# Patient Record
Sex: Male | Born: 1985 | Race: Asian | Hispanic: Yes | Marital: Single | Smoking: Never smoker
Health system: Southern US, Community
[De-identification: ages and names within clinical notes are randomized; demographics above are authoritative.]

---

## 2014-11-10 ENCOUNTER — Emergency Department (HOSPITAL_COMMUNITY)
Admission: EM | Admit: 2014-11-10 | Discharge: 2014-11-10 | Disposition: A | Discharge status: Home / Self Care | Payer: No Typology Code available for payment source | Attending: Emergency Medicine | Admitting: Emergency Medicine

## 2014-11-10 ENCOUNTER — Emergency Department (HOSPITAL_COMMUNITY): Payer: No Typology Code available for payment source

## 2014-11-10 ENCOUNTER — Encounter (HOSPITAL_COMMUNITY): Payer: Self-pay | Admitting: *Deleted

## 2014-11-10 DIAGNOSIS — S199XXA Unspecified injury of neck, initial encounter: Secondary | ICD-10-CM | POA: Diagnosis not present

## 2014-11-10 DIAGNOSIS — Y998 Other external cause status: Secondary | ICD-10-CM | POA: Insufficient documentation

## 2014-11-10 DIAGNOSIS — Y9389 Activity, other specified: Secondary | ICD-10-CM | POA: Diagnosis not present

## 2014-11-10 DIAGNOSIS — S0990XA Unspecified injury of head, initial encounter: Secondary | ICD-10-CM | POA: Diagnosis present

## 2014-11-10 DIAGNOSIS — Y92410 Unspecified street and highway as the place of occurrence of the external cause: Secondary | ICD-10-CM | POA: Diagnosis not present

## 2014-11-10 LAB — CBC
HEMATOCRIT: 43.6 % (ref 39.0–52.0)
HEMOGLOBIN: 15.9 g/dL (ref 13.0–17.0)
MCH: 31.5 pg (ref 26.0–34.0)
MCHC: 36.5 g/dL — ABNORMAL HIGH (ref 30.0–36.0)
MCV: 86.3 fL (ref 78.0–100.0)
Platelets: 227 10*3/uL (ref 150–400)
RBC: 5.05 MIL/uL (ref 4.22–5.81)
RDW: 12.1 % (ref 11.5–15.5)
WBC: 6.9 10*3/uL (ref 4.0–10.5)

## 2014-11-10 LAB — RAPID URINE DRUG SCREEN, HOSP PERFORMED
Amphetamines: NOT DETECTED
BENZODIAZEPINES: NOT DETECTED
Barbiturates: NOT DETECTED
COCAINE: NOT DETECTED
OPIATES: NOT DETECTED
Tetrahydrocannabinol: NOT DETECTED

## 2014-11-10 LAB — CDS SEROLOGY

## 2014-11-10 LAB — COMPREHENSIVE METABOLIC PANEL
ALK PHOS: 64 U/L (ref 38–126)
ALT: 14 U/L — AB (ref 17–63)
AST: 22 U/L (ref 15–41)
Albumin: 4.1 g/dL (ref 3.5–5.0)
Anion gap: 6 (ref 5–15)
BILIRUBIN TOTAL: 0.9 mg/dL (ref 0.3–1.2)
BUN: 14 mg/dL (ref 6–20)
CALCIUM: 9 mg/dL (ref 8.9–10.3)
CO2: 26 mmol/L (ref 22–32)
Chloride: 106 mmol/L (ref 101–111)
Creatinine, Ser: 0.98 mg/dL (ref 0.61–1.24)
GFR calc Af Amer: >60 mL/min (ref >60)
GFR calc non Af Amer: >60 mL/min (ref >60)
Glucose, Bld: 102 mg/dL — ABNORMAL HIGH (ref 65–99)
POTASSIUM: 4 mmol/L (ref 3.5–5.1)
SODIUM: 138 mmol/L (ref 135–145)
Total Protein: 6.4 g/dL — ABNORMAL LOW (ref 6.5–8.1)

## 2014-11-10 LAB — PROTIME-INR
INR: 1.09 (ref 0.00–1.49)
PROTHROMBIN TIME: 14.3 s (ref 11.6–15.2)

## 2014-11-10 LAB — SAMPLE TO BLOOD BANK

## 2014-11-10 LAB — ETHANOL: Alcohol, Ethyl (B): <5 mg/dL (ref <5)

## 2014-11-10 MED ORDER — IBUPROFEN 600 MG PO TABS: 600.0000 mg | ORAL_TABLET | Freq: Three times a day (TID) | ORAL | Status: AC | PRN

## 2014-11-10 NOTE — ED Notes (Signed)
PT monitored by pulse ox, bp cuff, and 12-lead. 

## 2014-11-10 NOTE — ED Notes (Signed)
RN used interpreter line to discharge pt. Pt's family at bedside to take him home

## 2014-11-10 NOTE — ED Notes (Signed)
C-Collar removed by MD

## 2014-11-10 NOTE — ED Notes (Signed)
Pt ambulatory. No difficulty walking

## 2014-11-10 NOTE — ED Notes (Signed)
Clinical Interpreter at bedside
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
## 2014-11-10 NOTE — ED Notes (Signed)
Pts family at bedside

## 2015-01-20 ENCOUNTER — Emergency Department (HOSPITAL_COMMUNITY): Payer: Self-pay

## 2015-01-20 ENCOUNTER — Emergency Department (HOSPITAL_COMMUNITY)
Admission: EM | Admit: 2015-01-20 | Discharge: 2015-01-21 | Disposition: A | Discharge status: Home / Self Care | Payer: Self-pay | Attending: Emergency Medicine | Admitting: Emergency Medicine

## 2015-01-20 ENCOUNTER — Encounter (HOSPITAL_COMMUNITY): Payer: Self-pay | Admitting: Emergency Medicine

## 2015-01-20 DIAGNOSIS — S0285XA Fracture of orbit, unspecified, initial encounter for closed fracture: Secondary | ICD-10-CM

## 2015-01-20 DIAGNOSIS — Y9289 Other specified places as the place of occurrence of the external cause: Secondary | ICD-10-CM | POA: Insufficient documentation

## 2015-01-20 DIAGNOSIS — F10129 Alcohol abuse with intoxication, unspecified: Secondary | ICD-10-CM | POA: Insufficient documentation

## 2015-01-20 DIAGNOSIS — Y998 Other external cause status: Secondary | ICD-10-CM | POA: Insufficient documentation

## 2015-01-20 DIAGNOSIS — Y9389 Activity, other specified: Secondary | ICD-10-CM | POA: Insufficient documentation

## 2015-01-20 DIAGNOSIS — S0990XA Unspecified injury of head, initial encounter: Secondary | ICD-10-CM | POA: Insufficient documentation

## 2015-01-20 DIAGNOSIS — S023XXA Fracture of orbital floor, initial encounter for closed fracture: Secondary | ICD-10-CM | POA: Insufficient documentation
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
## 2015-01-21 ENCOUNTER — Encounter (HOSPITAL_COMMUNITY): Payer: Self-pay | Admitting: Radiology

## 2015-01-21 LAB — BASIC METABOLIC PANEL
Anion gap: 14 (ref 5–15)
BUN: 9 mg/dL (ref 6–20)
CO2: 21 mmol/L — ABNORMAL LOW (ref 22–32)
Calcium: 8.5 mg/dL — ABNORMAL LOW (ref 8.9–10.3)
Chloride: 101 mmol/L (ref 101–111)
Creatinine, Ser: 0.77 mg/dL (ref 0.61–1.24)
GFR calc Af Amer: >60 mL/min (ref >60)
GFR calc non Af Amer: >60 mL/min (ref >60)
GLUCOSE: 102 mg/dL — AB (ref 65–99)
POTASSIUM: 3.7 mmol/L (ref 3.5–5.1)
Sodium: 136 mmol/L (ref 135–145)

## 2015-01-21 LAB — CBC WITH DIFFERENTIAL/PLATELET
Basophils Absolute: 0 10*3/uL (ref 0.0–0.1)
Basophils Relative: 0 %
EOS PCT: 0 %
Eosinophils Absolute: 0 10*3/uL (ref 0.0–0.7)
HEMATOCRIT: 44.4 % (ref 39.0–52.0)
Hemoglobin: 15.9 g/dL (ref 13.0–17.0)
LYMPHS ABS: 1.1 10*3/uL (ref 0.7–4.0)
Lymphocytes Relative: 6 %
MCH: 31.2 pg (ref 26.0–34.0)
MCHC: 35.8 g/dL (ref 30.0–36.0)
MCV: 87.2 fL (ref 78.0–100.0)
Monocytes Absolute: 0.5 10*3/uL (ref 0.1–1.0)
Monocytes Relative: 3 %
NEUTROS ABS: 17.3 10*3/uL — AB (ref 1.7–7.7)
Neutrophils Relative %: 91 %
PLATELETS: 237 10*3/uL (ref 150–400)
RBC: 5.09 MIL/uL (ref 4.22–5.81)
RDW: 12.4 % (ref 11.5–15.5)
WBC: 18.9 10*3/uL — ABNORMAL HIGH (ref 4.0–10.5)

## 2015-01-21 LAB — ETHANOL: Alcohol, Ethyl (B): 171 mg/dL — ABNORMAL HIGH (ref <5)

## 2015-01-21 MED ORDER — HYDROCODONE-ACETAMINOPHEN 5-325 MG PO TABS: 2.0000 | ORAL_TABLET | ORAL | Status: AC | PRN

## 2015-01-21 MED ORDER — CEPHALEXIN 500 MG PO CAPS: 500.0000 mg | ORAL_CAPSULE | Freq: Four times a day (QID) | ORAL | Status: AC

## 2015-01-21 MED ORDER — IOHEXOL 300 MG/ML  SOLN
80.0000 mL | Freq: Once | INTRAMUSCULAR | Status: AC | PRN
  Administered 2015-01-21: 100 mL via INTRAVENOUS

## 2015-01-21 NOTE — Discharge Instructions (Signed)
Keflex as prescribed.  Hydrocodone as prescribed as needed for pain.  Ice your eye for 20 minutes every 2 hours while awake for the next 2 days.  Call Dr. Simeon Craft in the ENT office to arrange a follow-up appointment in the next 2-3 days. His office contact information has been provided in this discharge summary.   Fractura facial (Facial Fracture) Una fractura facial es la ruptura de uno de los huesos de la cara. INSTRUCCIONES PARA EL CUIDADO DOMICILIARIO  Proteja la parte lesionada del rostro hasta que se cure.  No participe en actividades que posibiliten una nueva lesin hasta que el mdico lo apruebe.  Lave y seque su rostro suavemente.  Utilice proteccin en la cabeza y la cara cuando conduzca una bicicleta, un ciclomotor o una motonieve. SOLICITE ATENCIN MDICA SI:  La temperatura oral se eleva sin motivo por encima de 38,9 C (13 F) o segn le indique el profesional que lo asiste.  Sufre un dolor de cabeza intenso o nota cambios en la visin.  Siente el rostro adormecido o cosquilleos.  Si tiene nuseas (ganas de vomitar), vmitos o rigidez en el cuello. SOLICITE ATENCIN MDICA DE INMEDIATO SI:  Presenta dificultad para ver o visin doble.  Se siente mareado, aturdido o se desmaya.  Tiene problemas para hablar, respirar o tragar.  Presenta una secrecin acuosa que proviene de la nariz o del odo. EST SEGURO QUE:  Comprende las instrucciones para el alta mdica.  Controlar su enfermedad.  Solicitar atencin mdica de inmediato segn las indicaciones. Document Released: 01/18/2005 Document Revised: 07/03/2011 [facility] Patient Information 2015 [place]. This information is not intended to replace advice given to you by your health care provider. Make sure you discuss any questions you have with your health care provider.

## 2015-01-21 NOTE — ED Notes (Signed)
[employer] at bedside per pt request
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
## 2015-11-26 ENCOUNTER — Emergency Department (HOSPITAL_COMMUNITY): Payer: Self-pay

## 2015-11-26 ENCOUNTER — Emergency Department (HOSPITAL_COMMUNITY)
Admission: EM | Admit: 2015-11-26 | Discharge: 2015-11-27 | Disposition: A | Discharge status: Home / Self Care | Payer: Self-pay | Attending: Emergency Medicine | Admitting: Emergency Medicine

## 2015-11-26 ENCOUNTER — Encounter (HOSPITAL_COMMUNITY): Payer: Self-pay | Admitting: Radiology

## 2015-11-26 DIAGNOSIS — S52592A Other fractures of lower end of left radius, initial encounter for closed fracture: Secondary | ICD-10-CM | POA: Insufficient documentation

## 2015-11-26 DIAGNOSIS — Y9301 Activity, walking, marching and hiking: Secondary | ICD-10-CM | POA: Insufficient documentation

## 2015-11-26 DIAGNOSIS — T1491 Suicide attempt: Secondary | ICD-10-CM | POA: Insufficient documentation

## 2015-11-26 DIAGNOSIS — S60417A Abrasion of left little finger, initial encounter: Secondary | ICD-10-CM | POA: Insufficient documentation

## 2015-11-26 DIAGNOSIS — C8520 Mediastinal (thymic) large B-cell lymphoma, unspecified site: Secondary | ICD-10-CM | POA: Insufficient documentation

## 2015-11-26 DIAGNOSIS — S80211A Abrasion, right knee, initial encounter: Secondary | ICD-10-CM | POA: Insufficient documentation

## 2015-11-26 DIAGNOSIS — R791 Abnormal coagulation profile: Secondary | ICD-10-CM | POA: Insufficient documentation

## 2015-11-26 DIAGNOSIS — S60412A Abrasion of right middle finger, initial encounter: Secondary | ICD-10-CM | POA: Insufficient documentation

## 2015-11-26 DIAGNOSIS — F10120 Alcohol abuse with intoxication, uncomplicated: Secondary | ICD-10-CM | POA: Insufficient documentation

## 2015-11-26 DIAGNOSIS — T1491XA Suicide attempt, initial encounter: Secondary | ICD-10-CM

## 2015-11-26 DIAGNOSIS — Z5181 Encounter for therapeutic drug level monitoring: Secondary | ICD-10-CM | POA: Insufficient documentation

## 2015-11-26 DIAGNOSIS — S0181XA Laceration without foreign body of other part of head, initial encounter: Secondary | ICD-10-CM

## 2015-11-26 DIAGNOSIS — F1014 Alcohol abuse with alcohol-induced mood disorder: Secondary | ICD-10-CM | POA: Diagnosis present

## 2015-11-26 DIAGNOSIS — Y92410 Unspecified street and highway as the place of occurrence of the external cause: Secondary | ICD-10-CM | POA: Insufficient documentation

## 2015-11-26 DIAGNOSIS — F10920 Alcohol use, unspecified with intoxication, uncomplicated: Secondary | ICD-10-CM

## 2015-11-26 DIAGNOSIS — S01511A Laceration without foreign body of lip, initial encounter: Secondary | ICD-10-CM | POA: Insufficient documentation

## 2015-11-26 DIAGNOSIS — S62102A Fracture of unspecified carpal bone, left wrist, initial encounter for closed fracture: Secondary | ICD-10-CM

## 2015-11-26 DIAGNOSIS — F3289 Other specified depressive episodes: Secondary | ICD-10-CM

## 2015-11-26 DIAGNOSIS — Y999 Unspecified external cause status: Secondary | ICD-10-CM | POA: Insufficient documentation

## 2015-11-26 DIAGNOSIS — S022XXA Fracture of nasal bones, initial encounter for closed fracture: Secondary | ICD-10-CM | POA: Insufficient documentation

## 2015-11-26 LAB — COMPREHENSIVE METABOLIC PANEL
ALK PHOS: 66 U/L (ref 38–126)
ALT: 14 U/L — ABNORMAL LOW (ref 17–63)
ANION GAP: 10 (ref 5–15)
AST: 20 U/L (ref 15–41)
Albumin: 4.2 g/dL (ref 3.5–5.0)
BUN: 10 mg/dL (ref 6–20)
CALCIUM: 8.6 mg/dL — AB (ref 8.9–10.3)
CHLORIDE: 107 mmol/L (ref 101–111)
CO2: 23 mmol/L (ref 22–32)
Creatinine, Ser: 0.94 mg/dL (ref 0.61–1.24)
GFR calc non Af Amer: >60 mL/min (ref >60)
Glucose, Bld: 94 mg/dL (ref 65–99)
Potassium: 3.2 mmol/L — ABNORMAL LOW (ref 3.5–5.1)
SODIUM: 140 mmol/L (ref 135–145)
Total Bilirubin: 0.5 mg/dL (ref 0.3–1.2)
Total Protein: 6.7 g/dL (ref 6.5–8.1)

## 2015-11-26 LAB — PROTIME-INR
INR: 1.06
PROTHROMBIN TIME: 13.8 s (ref 11.4–15.2)

## 2015-11-26 LAB — CBC
HCT: 43.9 % (ref 39.0–52.0)
HEMOGLOBIN: 15.6 g/dL (ref 13.0–17.0)
MCH: 31.1 pg (ref 26.0–34.0)
MCHC: 35.5 g/dL (ref 30.0–36.0)
MCV: 87.6 fL (ref 78.0–100.0)
Platelets: 206 10*3/uL (ref 150–400)
RBC: 5.01 MIL/uL (ref 4.22–5.81)
RDW: 12.4 % (ref 11.5–15.5)
WBC: 9.2 10*3/uL (ref 4.0–10.5)

## 2015-11-26 LAB — RAPID URINE DRUG SCREEN, HOSP PERFORMED
Amphetamines: NOT DETECTED
BARBITURATES: NOT DETECTED
Benzodiazepines: NOT DETECTED
Cocaine: POSITIVE — AB
Opiates: NOT DETECTED
TETRAHYDROCANNABINOL: NOT DETECTED

## 2015-11-26 LAB — I-STAT CG4 LACTIC ACID, ED
Lactic Acid, Venous: 1.59 mmol/L (ref 0.5–1.9)
Lactic Acid, Venous: 2.44 mmol/L (ref 0.5–1.9)

## 2015-11-26 LAB — URINALYSIS, ROUTINE W REFLEX MICROSCOPIC
BILIRUBIN URINE: NEGATIVE
GLUCOSE, UA: NEGATIVE mg/dL
Hgb urine dipstick: NEGATIVE
KETONES UR: NEGATIVE mg/dL
LEUKOCYTES UA: NEGATIVE
Nitrite: NEGATIVE
PH: 5.5 (ref 5.0–8.0)
PROTEIN: NEGATIVE mg/dL
Specific Gravity, Urine: 1.028 (ref 1.005–1.030)

## 2015-11-26 LAB — SAMPLE TO BLOOD BANK

## 2015-11-26 LAB — CDS SEROLOGY

## 2015-11-26 LAB — I-STAT CHEM 8, ED
BUN: 11 mg/dL (ref 6–20)
CALCIUM ION: 1.01 mmol/L — AB (ref 1.13–1.30)
Chloride: 105 mmol/L (ref 101–111)
Creatinine, Ser: 1.1 mg/dL (ref 0.61–1.24)
GLUCOSE: 96 mg/dL (ref 65–99)
HEMATOCRIT: 46 % (ref 39.0–52.0)
HEMOGLOBIN: 15.6 g/dL (ref 13.0–17.0)
Potassium: 3.2 mmol/L — ABNORMAL LOW (ref 3.5–5.1)
SODIUM: 143 mmol/L (ref 135–145)
TCO2: 23 mmol/L (ref 0–100)

## 2015-11-26 LAB — RAPID HIV SCREEN (HIV 1/2 AB+AG)
HIV 1/2 ANTIBODIES: NONREACTIVE
HIV-1 P24 ANTIGEN - HIV24: NONREACTIVE

## 2015-11-26 LAB — ETHANOL: ALCOHOL ETHYL (B): 209 mg/dL — AB (ref <5)

## 2015-11-26 MED ORDER — ACETAMINOPHEN 325 MG PO TABS: 650.0000 mg | ORAL_TABLET | ORAL | Status: DC | PRN

## 2015-11-26 MED ORDER — SODIUM CHLORIDE 0.9 % IV BOLUS (SEPSIS)
1000.0000 mL | Freq: Once | INTRAVENOUS | Status: AC
  Administered 2015-11-26: 1000 mL via INTRAVENOUS

## 2015-11-26 MED ORDER — SODIUM CHLORIDE 0.9 % IV SOLN
INTRAVENOUS | Status: DC
  Administered 2015-11-26: 125 mL/h via INTRAVENOUS

## 2015-11-26 MED ORDER — ONDANSETRON HCL 4 MG PO TABS: 4.0000 mg | ORAL_TABLET | Freq: Three times a day (TID) | ORAL | Status: DC | PRN

## 2015-11-26 MED ORDER — LORAZEPAM 1 MG PO TABS: 1.0000 mg | ORAL_TABLET | Freq: Four times a day (QID) | ORAL | Status: DC | PRN

## 2015-11-26 MED ORDER — THIAMINE HCL 100 MG/ML IJ SOLN: 100.0000 mg | Freq: Every day | INTRAMUSCULAR | Status: DC

## 2015-11-26 MED ORDER — AMOXICILLIN-POT CLAVULANATE 875-125 MG PO TABS
1.0000 | ORAL_TABLET | Freq: Two times a day (BID) | ORAL | Status: DC
  Administered 2015-11-26 – 2015-11-27 (×2): 1 via ORAL
  Filled 2015-11-26 (×2): qty 1

## 2015-11-26 MED ORDER — LORAZEPAM 2 MG/ML IJ SOLN: 1.0000 mg | Freq: Four times a day (QID) | INTRAMUSCULAR | Status: DC | PRN

## 2015-11-26 MED ORDER — LIDOCAINE HCL (PF) 1 % IJ SOLN
30.0000 mL | Freq: Once | INTRAMUSCULAR | Status: AC
  Administered 2015-11-26: 30 mL
  Filled 2015-11-26: qty 30

## 2015-11-26 MED ORDER — VITAMIN B-1 100 MG PO TABS
100.0000 mg | ORAL_TABLET | Freq: Every day | ORAL | Status: DC
  Administered 2015-11-26: 100 mg via ORAL
  Filled 2015-11-26: qty 1

## 2015-11-26 MED ORDER — LORAZEPAM 1 MG PO TABS
0.0000 mg | ORAL_TABLET | Freq: Four times a day (QID) | ORAL | Status: DC
Start: 2015-11-26 — End: 2015-11-27

## 2015-11-26 MED ORDER — SODIUM CHLORIDE 0.9 % IV SOLN
INTRAVENOUS | Status: DC | PRN
Start: 2015-11-26 — End: 2015-11-27
  Administered 2015-11-26: 1000 mL via INTRAVENOUS

## 2015-11-26 MED ORDER — LORAZEPAM 2 MG/ML IJ SOLN
INTRAMUSCULAR | Status: AC
  Filled 2015-11-26: qty 1

## 2015-11-26 MED ORDER — IOPAMIDOL (ISOVUE-300) INJECTION 61%
INTRAVENOUS | Status: AC
  Administered 2015-11-26: 100 mL
  Filled 2015-11-26: qty 100

## 2015-11-26 MED ORDER — LORAZEPAM 1 MG PO TABS: 0.0000 mg | ORAL_TABLET | Freq: Two times a day (BID) | ORAL | Status: DC

## 2015-11-26 MED ORDER — ADULT MULTIVITAMIN W/MINERALS CH
1.0000 | ORAL_TABLET | Freq: Every day | ORAL | Status: DC
Start: 2015-11-26 — End: 2015-11-27
  Administered 2015-11-26: 1 via ORAL
  Filled 2015-11-26: qty 1

## 2015-11-26 MED ORDER — LORAZEPAM 2 MG/ML IJ SOLN
INTRAMUSCULAR | Status: AC | PRN
  Administered 2015-11-26: 1 mg via INTRAVENOUS

## 2015-11-26 MED ORDER — FOLIC ACID 1 MG PO TABS
1.0000 mg | ORAL_TABLET | Freq: Every day | ORAL | Status: DC
  Administered 2015-11-26: 1 mg via ORAL
  Filled 2015-11-26: qty 1

## 2015-11-26 MED ORDER — LORAZEPAM 2 MG/ML IJ SOLN: 1.0000 mg | Freq: Once | INTRAMUSCULAR | Status: DC

## 2015-11-26 MED ORDER — TETANUS-DIPHTH-ACELL PERTUSSIS 5-2.5-18.5 LF-MCG/0.5 IM SUSP
0.5000 mL | Freq: Once | INTRAMUSCULAR | Status: AC
  Administered 2015-11-26: 0.5 mL via INTRAMUSCULAR
  Filled 2015-11-26: qty 0.5

## 2015-11-26 MED ORDER — SODIUM CHLORIDE 0.9 % IV BOLUS (SEPSIS): 1000.0000 mL | Freq: Once | INTRAVENOUS | Status: DC

## 2015-11-26 MED ORDER — IBUPROFEN 400 MG PO TABS: 600.0000 mg | ORAL_TABLET | Freq: Three times a day (TID) | ORAL | Status: DC | PRN

## 2015-11-26 NOTE — ED Notes (Signed)
Meal tray ordered 

## 2015-11-26 NOTE — ED Notes (Signed)
TTS in process 
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
## 2015-11-26 NOTE — BHH Counselor (Signed)
TTS requested machine be put in room to attempt to assess pt. RN notified TTS counselor that pt was not communicating much to staff at this time. TTS called into machine and it is not connecting at this time. RN stated she would go see if she could get it to connect. TTS will call back in 10 min.

## 2015-11-26 NOTE — Progress Notes (Signed)
Orthopedic Tech Progress Note Patient Details:  Louis Jones 1985/08/14 [id number]  Ortho Devices Type of Ortho Device: Sugartong splint Ortho Device/Splint Location: lue Ortho Device/Splint Interventions: Ordered, Application   Karolee Stamps 11/26/2015, 4:56 AM

## 2015-11-26 NOTE — ED Provider Notes (Signed)
TTS has seen and recommends overnight observation. Currently IVC'd   Louis Gambler, MD 11/26/15 1517

## 2015-11-26 NOTE — ED Notes (Signed)
Patient given breakfast. 

## 2015-11-26 NOTE — ED Notes (Signed)
Patient in CT

## 2015-11-26 NOTE — ED Notes (Signed)
Rn attempted to assess the patient with interpertur patient states he did not want to talk

## 2015-11-26 NOTE — ED Notes (Signed)
Pt resting in bed.
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
## 2015-11-26 NOTE — BHH Counselor (Signed)
Attempted to call cart 2 back with no success. Machine is not connecting to call. Not able to assess pt at this time.

## 2015-11-26 NOTE — ED Notes (Signed)
RN ordered patient meal tray.

## 2015-11-26 NOTE — ED Provider Notes (Signed)
Asked to perform a laceration repair by Dr. Wyvonnia Dusky.    LACERATION REPAIR Performed by: Lorayne Bender Authorized by: Lorayne Bender Consent: Verbal consent obtained. Risks and benefits: risks, benefits and alternatives were discussed Consent given by: patient Patient identity confirmed: provided demographic data Prepped and Draped in normal sterile fashion Wound explored  Laceration Location: Outer lower lip  Laceration Length: 1.5 cm  No Foreign Bodies seen or palpated  Anesthesia: local infiltration  Local anesthetic: lidocaine 1%  Anesthetic total: 2 ml  Irrigation method: syringe Amount of cleaning: standard  Skin closure: 5-0 Prolene   Number of sutures: 4   Technique: Simple interrupted   Patient tolerance: Patient tolerated the procedure well with no immediate complications.   Lorayne Bender, PA-C 11/26/15 0401    Ezequiel Essex, MD 11/26/15 [phone]

## 2015-11-26 NOTE — ED Notes (Signed)
Patient states he does not want to eat.

## 2015-11-26 NOTE — ED Notes (Signed)
TTS machine at bedside. 

## 2015-11-27 DIAGNOSIS — F3289 Other specified depressive episodes: Secondary | ICD-10-CM

## 2015-11-27 DIAGNOSIS — F1014 Alcohol abuse with alcohol-induced mood disorder: Secondary | ICD-10-CM | POA: Diagnosis present

## 2015-11-27 MED ORDER — NAPROXEN 500 MG PO TABS: 500.0000 mg | ORAL_TABLET | Freq: Two times a day (BID) | ORAL | 0 refills | Status: AC

## 2015-11-27 NOTE — ED Notes (Signed)
IVC paperwork rescinded by MD Rogene Houston, confirmation fax received. Pt discharge instructions reviewed. Pt denies SI/HI. VSS. A/o x4. Verbalizes understanding of follow up.

## 2015-11-27 NOTE — Discharge Instructions (Signed)
Keep the splint on the left arm. Suture removal from lower lip in 5-7 days. Follow-up with orthopedics for the fracture. Call Dr. Lorin Mercy office for follow-up. Return for any new or worse symptoms. Behavioral Health has cleared you to go home.

## 2015-11-27 NOTE — ED Notes (Signed)
Reg breakfast tray/NO SHARPS ordered

## 2015-11-27 NOTE — ED Notes (Signed)
Pt leaving department with all belongings. Friend to drive patient home.
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
## 2015-11-29 ENCOUNTER — Telehealth (HOSPITAL_COMMUNITY): Payer: Self-pay

## 2015-11-29 ENCOUNTER — Encounter (HOSPITAL_COMMUNITY): Payer: Self-pay | Admitting: Radiology

## 2015-11-29 ENCOUNTER — Emergency Department (HOSPITAL_COMMUNITY)
Admission: EM | Admit: 2015-11-29 | Discharge: 2015-11-29 | Disposition: A | Discharge status: Home / Self Care | Payer: Self-pay | Attending: Emergency Medicine | Admitting: Emergency Medicine

## 2015-11-29 ENCOUNTER — Encounter (HOSPITAL_COMMUNITY): Payer: Self-pay

## 2015-11-29 DIAGNOSIS — Z4802 Encounter for removal of sutures: Secondary | ICD-10-CM | POA: Insufficient documentation
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
## 2015-11-29 NOTE — ED Provider Notes (Signed)
[facility] DEPT Provider Note   CSN: [id number] Arrival date & time: 11/29/15  1552  First Provider Contact:   First MD Initiated Contact with Patient 11/29/15 1709     By signing my name below, I, Louis Jones, attest that this documentation has been prepared under the direction and in the presence of non-physician practitioner, Etta Quill, NP. Electronically Signed: Evelene Jones, Scribe. 11/29/2015. 5:10 PM.   History   Chief Complaint Chief Complaint  Patient presents with  . Suture / Staple Removal    The history is provided by the patient. A language interpreter was used ([place]).  Suture / Staple Removal  This is a new problem. The current episode started more than 2 days ago. The problem has not changed since onset.Pertinent negatives include no chest pain, no abdominal pain, no headaches and no shortness of breath.     HPI Comments:  Louis Jones is a 30 y.o. male who presents to the Emergency Department to have sutures removed from his lower lip. Pt states he had them placed last Thursday and was advised to return to have stitched removed. He denies drainage from the site but notes continued moderate pain. Pt was seen in the ED on 11/26/15 and had 4 sutures placed. At this time pt has no new complaints.    No past medical history on file.  Patient Active Problem List   Diagnosis Date Noted  . 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     Home Medications    Prior to Admission medications   Medication Sig Start Date End Date Taking? Authorizing Provider  cephALEXin (KEFLEX) 500 MG capsule Take 1 capsule (500 mg total) by mouth 4 (four) times daily. 01/21/15   Veryl Speak, MD  HYDROcodone-acetaminophen (NORCO) 5-325 MG tablet Take 2 tablets by mouth every 4 (four) hours as needed. 01/21/15   Veryl Speak, MD  ibuprofen (ADVIL,MOTRIN) 600 MG tablet Take 1 tablet (600 mg total) by mouth  every 8 (eight) hours as needed. 11/10/14   Jola Schmidt, MD  naproxen (NAPROSYN) 500 MG tablet Take 1 tablet (500 mg total) by mouth 2 (two) times daily. 11/27/15   Fredia Sorrow, MD    Family History No family history on file.  Social History Social History  Substance Use Topics  . Smoking status: Never Smoker  . Smokeless tobacco: Never Used  . Alcohol use Yes     Allergies   Review of patient's allergies indicates no known allergies.   Review of Systems Review of Systems  Respiratory: Negative for shortness of breath.   Cardiovascular: Negative for chest pain.  Gastrointestinal: Negative for abdominal pain.  Skin: Positive for wound (suture removal).  Neurological: Negative for headaches.     Physical Exam Updated Vital Signs BP 112/64   Pulse 85   Temp 98.1 F (36.7 C) (Oral)   SpO2 98%   Physical Exam  Constitutional: He is oriented to person, place, and time. He appears well-developed and well-nourished. No distress.  HENT:  Head: Normocephalic and atraumatic.  Eyes: Conjunctivae are normal.  Cardiovascular: Normal rate.   Pulmonary/Chest: Effort normal.  Neurological: He is alert and oriented to person, place, and time.  Skin: Skin is warm and dry.  Psychiatric: He has a normal mood and affect.  Nursing note and vitals reviewed.   ED Treatments / Results  DIAGNOSTIC STUDIES:  Oxygen Saturation is 98% on RA, normal by my interpretation.  COORDINATION OF CARE:  5:21 PM Discussed treatment plan with pt at bedside with the aid of the language line interpreter and pt agreed to plan.  Labs (all labs ordered are listed, but only abnormal results are displayed) Labs Reviewed - No data to display  EKG  EKG Interpretation None       Radiology No results found.  Procedures .Suture Removal Date/Time: 11/29/2015 5:14 PM Performed by: Tamala Julian, Delano Frate Authorized by: Tamala Julian, Arta Stump   Consent:    Consent obtained:  Verbal   Consent given by:   Patient Location:    Location:  Mouth   Mouth location:  Lower exterior lip Procedure details:    Wound appearance:  No signs of infection   Number of sutures removed:  4 Post-procedure details:    Patient tolerance of procedure:  Tolerated well, no immediate complications      Medications Ordered in ED Medications - No data to display   Initial Impression / Assessment and Plan / ED Course  I have reviewed the triage vital signs and the nursing notes.  Pertinent labs & imaging results that were available during my care of the patient were reviewed by me and considered in my medical decision making (see chart for details).  Clinical Course   Suture removal   Pt to ER for staple/suture removal and wound check as above. Procedure tolerated well. Vitals normal, no signs of infection. Scar minimization & return precautions given at dc.      Final Clinical Impressions(s) / ED Diagnoses   Final diagnoses:  None  Suture removal.  New Prescriptions New Prescriptions   No medications on file    I personally performed the services described in this documentation, which was scribed in my presence. The recorded information has been reviewed and is accurate.    Etta Quill, NP 11/30/15 [id number]    Sherwood Gambler, MD 12/02/15 [phone]

## 2015-11-29 NOTE — ED Notes (Signed)
Declined W/C at D/C and was escorted to lobby by RN. 

## 2015-11-29 NOTE — ED Notes (Signed)
Pt only speaks spanish. Is having trouble scheduling follow-up care due to lack of translator.

## 2017-12-24 ENCOUNTER — Encounter (HOSPITAL_COMMUNITY): Payer: Self-pay | Admitting: Emergency Medicine

## 2017-12-24 ENCOUNTER — Other Ambulatory Visit: Payer: Self-pay

## 2017-12-24 ENCOUNTER — Emergency Department (HOSPITAL_COMMUNITY)
Admission: EM | Admit: 2017-12-24 | Discharge: 2017-12-25 | Disposition: A | Discharge status: Home / Self Care | Payer: Self-pay | Attending: Emergency Medicine | Admitting: Emergency Medicine

## 2017-12-24 DIAGNOSIS — R0789 Other chest pain: Secondary | ICD-10-CM | POA: Insufficient documentation

## 2017-12-24 DIAGNOSIS — Z23 Encounter for immunization: Secondary | ICD-10-CM | POA: Insufficient documentation

## 2017-12-24 DIAGNOSIS — Y9389 Activity, other specified: Secondary | ICD-10-CM | POA: Insufficient documentation

## 2017-12-24 DIAGNOSIS — S0083XA Contusion of other part of head, initial encounter: Secondary | ICD-10-CM | POA: Insufficient documentation

## 2017-12-24 DIAGNOSIS — Z79899 Other long term (current) drug therapy: Secondary | ICD-10-CM | POA: Insufficient documentation

## 2017-12-24 DIAGNOSIS — Y998 Other external cause status: Secondary | ICD-10-CM | POA: Insufficient documentation

## 2017-12-24 DIAGNOSIS — M25531 Pain in right wrist: Secondary | ICD-10-CM | POA: Insufficient documentation

## 2017-12-24 DIAGNOSIS — Y929 Unspecified place or not applicable: Secondary | ICD-10-CM | POA: Insufficient documentation
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
## 2017-12-25 ENCOUNTER — Emergency Department (HOSPITAL_COMMUNITY): Payer: Self-pay

## 2017-12-25 LAB — CBC WITH DIFFERENTIAL/PLATELET
Basophils Absolute: 0 10*3/uL (ref 0.0–0.1)
Basophils Relative: 0 %
EOS ABS: 0.2 10*3/uL (ref 0.0–0.7)
EOS PCT: 1 %
HCT: 46.5 % (ref 39.0–52.0)
Hemoglobin: 16.6 g/dL (ref 13.0–17.0)
LYMPHS ABS: 2.5 10*3/uL (ref 0.7–4.0)
Lymphocytes Relative: 16 %
MCH: 31.9 pg (ref 26.0–34.0)
MCHC: 35.7 g/dL (ref 30.0–36.0)
MCV: 89.4 fL (ref 78.0–100.0)
MONO ABS: 0.7 10*3/uL (ref 0.1–1.0)
MONOS PCT: 5 %
Neutro Abs: 11.8 10*3/uL — ABNORMAL HIGH (ref 1.7–7.7)
Neutrophils Relative %: 78 %
PLATELETS: 302 10*3/uL (ref 150–400)
RBC: 5.2 MIL/uL (ref 4.22–5.81)
RDW: 12.2 % (ref 11.5–15.5)
WBC: 15.1 10*3/uL — ABNORMAL HIGH (ref 4.0–10.5)

## 2017-12-25 LAB — COMPREHENSIVE METABOLIC PANEL
ALBUMIN: 4.7 g/dL (ref 3.5–5.0)
ALK PHOS: 67 U/L (ref 38–126)
ALT: 28 U/L (ref 0–44)
AST: 35 U/L (ref 15–41)
Anion gap: 14 (ref 5–15)
BUN: 15 mg/dL (ref 6–20)
CALCIUM: 9.5 mg/dL (ref 8.9–10.3)
CO2: 20 mmol/L — AB (ref 22–32)
CREATININE: 0.99 mg/dL (ref 0.61–1.24)
Chloride: 109 mmol/L (ref 98–111)
GFR calc non Af Amer: >60 mL/min (ref >60)
GLUCOSE: 109 mg/dL — AB (ref 70–99)
Potassium: 3.9 mmol/L (ref 3.5–5.1)
SODIUM: 143 mmol/L (ref 135–145)
Total Bilirubin: 0.5 mg/dL (ref 0.3–1.2)
Total Protein: 7.8 g/dL (ref 6.5–8.1)

## 2017-12-25 LAB — LIPASE, BLOOD: LIPASE: 32 U/L (ref 11–51)

## 2017-12-25 LAB — ETHANOL: Alcohol, Ethyl (B): 181 mg/dL — ABNORMAL HIGH (ref <10)

## 2017-12-25 MED ORDER — TETANUS-DIPHTH-ACELL PERTUSSIS 5-2.5-18.5 LF-MCG/0.5 IM SUSP
0.5000 mL | Freq: Once | INTRAMUSCULAR | Status: AC
  Administered 2017-12-25: 0.5 mL via INTRAMUSCULAR
  Filled 2017-12-25: qty 0.5

## 2017-12-25 NOTE — Discharge Instructions (Addendum)
Imaging today was normal   Take ibuprofen for pain. Ice.  Contact [facility] to establish care with a primary care provider for regular, routine medical care.  This clinic accepts patients without medical insurance. A primary care provider can adjust your daily medications and give you refills.   los estudios de [place] de hoy son normales  tome ibuprofeno para Conservation officer, historic buildings. aplicar hielo.   Comunquese con la clnica de salud y bienestar de la comunidad para Health visitor atencin con un proveedor de atencin primaria para atencin mdica regular y de [place].  Esta clnica acepta pacientes sin seguro mdico. Un proveedor de atencin primaria puede ajustar sus medicamentos diarios y darle Therapist, sports.   Lea la lista de recursos de servicios sociales y refugio
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
## 2017-12-25 NOTE — ED Notes (Signed)
[facility] to take patient home
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
